# Patient Record
Sex: Male | Born: 1980 | Race: White | Hispanic: No | Marital: Married
Health system: Midwestern US, Academic
[De-identification: ages and names within clinical notes are randomized; demographics above are authoritative.]

---

## 2018-03-23 ENCOUNTER — Emergency Department: Admit: 2018-03-23 | Discharge: 2018-03-23 | Payer: MEDICARE

## 2018-03-23 ENCOUNTER — Emergency Department: Admit: 2018-03-23 | Discharge: 2018-03-24 | Disposition: A | Discharge status: Home / Self Care | Payer: MEDICARE

## 2018-03-23 ENCOUNTER — Encounter: Admit: 2018-03-23 | Discharge: 2018-03-23 | Payer: MEDICARE

## 2018-03-23 DIAGNOSIS — IMO0001 Reflux: ICD-10-CM

## 2018-03-23 DIAGNOSIS — R419 Unspecified symptoms and signs involving cognitive functions and awareness: Secondary | ICD-10-CM

## 2018-03-23 DIAGNOSIS — I619 Nontraumatic intracerebral hemorrhage, unspecified: ICD-10-CM

## 2018-03-23 DIAGNOSIS — R6889 Other general symptoms and signs: Secondary | ICD-10-CM

## 2018-03-23 DIAGNOSIS — G40409 Other generalized epilepsy and epileptic syndromes, not intractable, without status epilepticus: ICD-10-CM

## 2018-03-23 DIAGNOSIS — G988 Other disorders of nervous system: ICD-10-CM

## 2018-03-23 DIAGNOSIS — Z982 Presence of cerebrospinal fluid drainage device: Principal | ICD-10-CM

## 2018-03-23 DIAGNOSIS — G919 Hydrocephalus, unspecified: ICD-10-CM

## 2018-03-23 DIAGNOSIS — K449 Diaphragmatic hernia without obstruction or gangrene: ICD-10-CM

## 2018-03-23 DIAGNOSIS — T8509XS Other mechanical complication of ventricular intracranial (communicating) shunt, sequela: Secondary | ICD-10-CM

## 2018-03-23 LAB — COMPREHENSIVE METABOLIC PANEL
Lab: 0.5 mg/dL (ref 0.3–1.2)
Lab: 0.8 mg/dL (ref 0.4–1.24)
Lab: 106 MMOL/L (ref 98–110)
Lab: 138 MMOL/L (ref 137–147)
Lab: 15 mg/dL (ref 7–25)
Lab: 3.8 MMOL/L (ref 3.5–5.1)
Lab: 4.5 g/dL (ref 3.5–5.0)
Lab: 7.4 g/dL (ref 6.0–8.0)
Lab: 9.5 mg/dL (ref 8.5–10.6)
Lab: 99 mg/dL (ref 70–100)
Lab: >60 mL/min (ref >60)

## 2018-03-23 LAB — CBC AND DIFF: Lab: 9.2 10*3/uL (ref 4.5–11.0)

## 2018-03-23 MED ORDER — ACETAMINOPHEN 500 MG PO TAB
1000 mg | ORAL | 0 refills | Status: DC | PRN
Start: 2018-03-23 — End: 2018-03-24
  Administered 2018-03-23: 20:00:00 1000 mg via ORAL

## 2018-03-24 DIAGNOSIS — R6889 Other general symptoms and signs: ICD-10-CM

## 2018-03-24 DIAGNOSIS — R51 Headache: Principal | ICD-10-CM

## 2018-03-24 DIAGNOSIS — R419 Unspecified symptoms and signs involving cognitive functions and awareness: ICD-10-CM

## 2018-03-24 DIAGNOSIS — T8509XS Other mechanical complication of ventricular intracranial (communicating) shunt, sequela: ICD-10-CM

## 2020-03-21 ENCOUNTER — Inpatient Hospital Stay: Admit: 2020-03-21 | Discharge: 2020-03-21 | Payer: MEDICARE

## 2020-03-21 ENCOUNTER — Encounter: Admit: 2020-03-21 | Discharge: 2020-03-21 | Payer: MEDICARE

## 2020-03-23 ENCOUNTER — Encounter: Admit: 2020-03-23 | Discharge: 2020-03-23 | Payer: MEDICARE

## 2020-03-23 DIAGNOSIS — Q039 Congenital hydrocephalus, unspecified: Secondary | ICD-10-CM

## 2020-03-23 NOTE — Progress Notes
Patient calls back.  Continual headache last 4 weeks, pain along shunt tract, dizziness nausea which are intermittent. Called patient with appointments.  Patient agreed.  If symptoms worsen instructed to come to ER.  Patient voices understanding.

## 2020-03-23 NOTE — Telephone Encounter
Dr. Suan Halter calls regarding patient.  He is patient's pcp in [place], [place].  Patient having ongoing severe headaches for past 4 weeks.  Is concerned there is a shunt malfunction.  He requests I reach out to patient and provide way for assessment of shunt.  Called patient via his cell.  No answer left contact information.  Will try again.

## 2020-03-31 ENCOUNTER — Encounter: Admit: 2020-03-31 | Discharge: 2020-03-31 | Payer: MEDICARE

## 2020-03-31 ENCOUNTER — Emergency Department: Admit: 2020-03-31 | Discharge: 2020-03-31 | Payer: MEDICARE

## 2020-03-31 ENCOUNTER — Emergency Department: Admit: 2020-03-31 | Discharge: 2020-04-01 | Payer: MEDICARE

## 2020-03-31 DIAGNOSIS — Z982 Presence of cerebrospinal fluid drainage device: Secondary | ICD-10-CM

## 2020-03-31 DIAGNOSIS — G4489 Other headache syndrome: Secondary | ICD-10-CM

## 2020-03-31 LAB — CBC AND DIFF
Lab: 0 10*3/uL (ref 0–0.20)
Lab: 0.1 10*3/uL (ref 0–0.45)
Lab: 9.7 10*3/uL (ref 4.5–11.0)
Lab: 17 (ref <20.7)

## 2020-03-31 LAB — COMPREHENSIVE METABOLIC PANEL
Lab: 0.6 mg/dL (ref 0.3–1.2)
Lab: 0.8 mg/dL (ref 0.4–1.24)
Lab: 10 K/UL — ABNORMAL HIGH (ref 3–12)
Lab: 105 MMOL/L (ref 98–110)
Lab: 109 mg/dL — ABNORMAL HIGH (ref 70–100)
Lab: 12 U/L (ref 7–56)
Lab: 137 MMOL/L (ref 137–147)
Lab: 14 mg/dL (ref 7–25)
Lab: 15 U/L (ref 7–40)
Lab: 22 MMOL/L (ref 21–30)
Lab: 4.4 MMOL/L (ref 3.5–5.1)
Lab: 4.7 g/dL (ref 3.5–5.0)
Lab: 7.5 g/dL (ref 6.0–8.0)
Lab: 75 U/L — ABNORMAL LOW (ref 25–110)
Lab: 9.7 mg/dL (ref 8.5–10.6)
Lab: >60 mL/min (ref >60)
Lab: >60 mL/min (ref >60)

## 2020-03-31 MED ORDER — ONDANSETRON HCL (PF) 4 MG/2 ML IJ SOLN
4 mg | Freq: Once | INTRAVENOUS | 0 refills | Status: CP
Start: 2020-03-31 — End: ?
  Administered 2020-04-01: 01:00:00 4 mg via INTRAVENOUS

## 2020-03-31 MED ORDER — LACTATED RINGERS IV SOLP
1000 mL | Freq: Once | INTRAVENOUS | 0 refills | Status: CP
Start: 2020-03-31 — End: ?

## 2020-03-31 MED ORDER — ACETAMINOPHEN 500 MG PO TAB
1000 mg | Freq: Once | ORAL | 0 refills | Status: CP
Start: 2020-03-31 — End: ?
  Administered 2020-03-31: 22:00:00 1000 mg via ORAL

## 2020-03-31 MED ORDER — IBUPROFEN 600 MG PO TAB
600 mg | Freq: Once | ORAL | 0 refills | Status: CP
Start: 2020-03-31 — End: ?
  Administered 2020-03-31: 22:00:00 600 mg via ORAL

## 2020-03-31 NOTE — ED Notes
Pt presents to [facility] CC headache x3 weeks. Pt Aox4, denies SOA or chest pains at this time. Pt presents with complex neurological history, including VP shunt. Provider at bedside at this time.     Belongings: Shirt, pants, socks, shoes, cell phone. Belongings at bedside with pt/family

## 2020-04-01 ENCOUNTER — Encounter: Admit: 2020-04-01 | Discharge: 2020-04-01 | Payer: MEDICARE

## 2020-04-01 LAB — COVID-19 (SARS-COV-2) PCR

## 2020-04-01 NOTE — Telephone Encounter
Spoke with patient and verified full name and date of birth. Informed patient of NEGATIVE COVID 19 testing. Patient was asked if they were still experiencing any symptoms and responded he was not having any symptoms.  Patient had no further questions.

## 2020-04-01 NOTE — ED Notes
Discharge instructions discussed with patient. Pt verbalized understanding; to follow up with PCP and come to ED if symptoms worsen. No further questions or concerns at this time. Pt alert and oriented. Pt ambulated with steady gate out of department with all pt belongings.

## 2020-04-01 NOTE — Consults
Neurosurgery Consult History and Physical Examination    Jeremy Bishop  Admission Date:  03/31/2020                       Assessment/Plan:  Jeremy Bishop?is a 39 y.o.?.male?with a PMH of a shunt since birth for congenital hydrocephalus and has had multiple revisions over time due to breakages and infection who presents to the ED with concern he may have a shunt malfunction.    Patient presented with several nonspecific findings.  Exam is notable for chronic disconjugate gaze, but is otherwise intact.  CT head and shunt series was reviewed, which showed no evidence of ventriculomegaly or shunt malfunction.    The patient was evaluated and instructed that his exam findings, imaging, and history was unlikely related to his shunt.  He was offered ultrasound of his abdomen to rule out any concerns for pseudocyst, which the patient has presented with in the past.  The patient opted for follow-up, which is already scheduled for 8/13 with possibility of ultrasound at this time.  All questions were answered.  Patient elected to go home at this time.     ?Okay to discharge from neurosurgery standpoint  ?Patient is scheduled for neurosurgery follow-up 8/13  ?May consider abdominal ultrasound at follow-up appointment    - Patient and plan discussed with neurosurgery attending and senior resident  - Please call [phone] with any changes in neurologic exam, questions, or concerns.    Newman Nickels, MD  [facility]     _______________________________________________________________________    Chief Complaint:  Concern for Shunt Malfunction   History of Present Illness:   Jeremy Bishop?is a 39 y.o.?male?with a PMH of a shunt since birth for congenital hydrocephalus and has had multiple revisions over time due to breakages and infection who presents to the ED with concern he may have a shunt malfunction.      Patient has been evaluated in the ED on several occasions, last 03/23/2018.  He has a history of presenting with vague complaints in the setting of prior shunt revisions and breakages.  He fears he may have a malfunction or breakage of the shunt.  The patient presented to the ED today with complaints of headaches, pain along the shunt tract, mild word finding difficulty, imbalance, lightheadedness, sleepiness, nausea, dizziness, short-term memory issues, and numbness in the right upper extremity and face.  He noticed about 2 weeks ago he had pump to shunt a few times with resolution of his symptoms briefly, which required.    ED evaluation and work-up with CT head and shunt series was negative.  Labs are within normal.  Patient is afebrile with normal white count.  The ED staff was planning to discharge the patient, however the patient wanted further evaluation by neurosurgery.     In the past, shunt malfunction is described with severe nausea, imbalance, headaches which are intolerable.  He reports that his current symptoms are not like his prior shunt malfunction.      Past Medical History:  Medical History:   Diagnosis Date   ? Hemorrhage in the brain [facility])    ? Hiatal hernia    ? Hydrocephalus (HCC)    ? Infection of VP shunt     multiple   ? Intracranial shunt    ? OTHER DIAGNOSIS - PLEASE ANNOTATE.     Hypertension (cannot be entered in the system)   ? Reflux    ? 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 SHUNT REVISION  10/2009    multiple times   ? PR CRTJ SHUNT VENTRICULO-PERITNEAL-PLEURAL TERMINUS         Social History:  Social History     Tobacco Use   ? Smoking status: Current Every Day Smoker     Packs/day: 0.50     Years: 1.00     Pack years: 0.50   ? Smokeless tobacco: Former Neurosurgeon     Quit date: 02/05/2010   Substance Use Topics   ? Alcohol use: Yes     Comment: drinks socially only, rarely   ? Drug use: No     Types: Marijuana     Comment: last used 3 days ago       Family History:  Family History   Problem Relation Age of Onset   ? Diabetes Paternal Grandmother    ? Stroke Paternal Grandmother    ? Diabetes Maternal Grandmother    ? Cancer Maternal Grandmother    ? Multiple sclerosis Other        Allergies:    Compazine [prochlorperazine edisylate]    Medications:  (Not in a Bishop admission)      Review of Systems:   Full 10 point review of systems negative except for HPI    Physical Exam:  Vital Signs: Last Filed In 24 Hours Vital Signs: 24 Hour Range   BP: 145/86 (08/04 1330)  Temp: 36.7 ?C (98.1 ?F) (08/04 1330)  Pulse: 82 (08/04 1330)  Respirations: 12 PER MINUTE (08/04 1330)  SpO2: 98 % (08/04 1330)  Height: 172.7 cm (68) (08/04 1330) BP: (145)/(86)   Temp:  [36.7 ?C (98.1 ?F)]   Pulse:  [82]   Respirations:  [12 PER MINUTE]   SpO2:  [98 %]    Intensity Pain Scale (Self Report): 10 (03/31/20 1422)      General appearance: No acute distress   Lungs: symmetric chest rise, non labored breathing   Heart: Regular rate and rhythm  Gastrointestinal: Soft, non-distended   Musculoskeletal: Atraumatic, no significant edema   Skin: No obvious skin lesion or rash   Psychiatric: Normal affect    Neurologic Exam:   Mental Status: Awake, alert and oriented x 4, fluent speech, normal cognition  Pupils: Pupils equal round and reactive to light  Cranial Nerves: CN II-XII individually tested and found to be intact, gag not tested  Vision: within normal to confrontation, disconjugate gaze  Motor:   AA EF EE WF WE FF FE FA G HF KF KE DF PF EHL   Left 5 5 5 5 5 5 5 5 5 5 5 5 5 5 5    Right 5 5 5 5 5 5 5 5 5 5 5 5 5 5 5    Normal muscle bulk and tone  No pronator drift  Sensation: Reports subjective diminished sensation of the right face and right upper extremity, grossly normal sensation  Deep Tendon Reflexes:   Pa Ac   Left 2 2   Right 2 2     No clonus bilaterally  No hoffman bilaterally   Gait: deferred  Cerebellar: No dysmetria on FTN    Lab Tests:  Hematology:    Lab Results   Component Value Date    HGB 15.7 03/31/2020    HCT 44.9 03/31/2020  PLTCT 228 03/31/2020    WBC 9.7 03/31/2020    NEUT 75 03/31/2020    ANC 7.29 03/31/2020    ALC 1.49 03/31/2020    MONA 8 03/31/2020    AMC 0.73 03/31/2020    ABC 0.08 03/31/2020    MCV 89.4 03/31/2020    MCHC 35.0 03/31/2020    MPV 7.9 03/31/2020    RDW 12.8 03/31/2020     General Chemistry:   Lab Results   Component Value Date    NA 137 03/31/2020    K 4.4 03/31/2020    CL 105 03/31/2020    CO2 22 03/31/2020    BUN 14 03/31/2020    CR 0.82 03/31/2020    GLU 109 03/31/2020    OBSCA 1.09 10/17/2009    CA 9.7 03/31/2020    MG 2.1 11/24/2009    PO4 4.0 11/24/2009     General Chemistry:   Lab Results   Component Value Date    GAP 10 03/31/2020    ALBUMIN 4.7 03/31/2020    TOTBILI 0.6 03/31/2020    DBILI <0.1 11/19/2009    TOTPROT 7.5 03/31/2020    AST 15 03/31/2020    ALT 12 03/31/2020    ALKPHOS 75 03/31/2020       Radiology and other Diagnostics Review:    Pertinent imaging reviewed.   Shunt series   No evidence of left shunt catheter disruption or acute angulation along   the skull and within the neck.     CT Head  1. Stable indwelling left frontal approach shunt. Unchanged decompressed   slitlike lateral and third ventricles.   2. No intracranial hemorrhage or extra-axial fluid collection.   3. 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

## 2020-04-09 ENCOUNTER — Encounter: Admit: 2020-04-09 | Discharge: 2020-04-09 | Payer: MEDICARE

## 2020-04-18 ENCOUNTER — Encounter: Admit: 2020-04-18 | Discharge: 2020-04-18 | Payer: MEDICARE

## 2020-04-18 NOTE — Progress Notes
39 yr old with shaking event at 0530. Awake during event. Labs unremarkable.     Imaging:   CT head- unchanged from previous   Hx:  Hydrocephalus. VP shunt    Recommendations:  Call NS and PCP for follow up

## 2020-04-19 ENCOUNTER — Encounter: Admit: 2020-04-19 | Discharge: 2020-04-19 | Payer: MEDICARE

## 2021-01-07 ENCOUNTER — Encounter: Admit: 2021-01-07 | Discharge: 2021-01-07 | Payer: MEDICARE

## 2021-01-07 DIAGNOSIS — I619 Nontraumatic intracerebral hemorrhage, unspecified: Secondary | ICD-10-CM

## 2021-01-07 DIAGNOSIS — G919 Hydrocephalus, unspecified: Secondary | ICD-10-CM

## 2021-01-07 DIAGNOSIS — K449 Diaphragmatic hernia without obstruction or gangrene: Secondary | ICD-10-CM

## 2021-01-07 DIAGNOSIS — Z982 Presence of cerebrospinal fluid drainage device: Secondary | ICD-10-CM

## 2021-01-07 DIAGNOSIS — G988 Other disorders of nervous system: Secondary | ICD-10-CM

## 2021-01-07 DIAGNOSIS — IMO0001 Reflux: Secondary | ICD-10-CM

## 2021-01-07 DIAGNOSIS — G40409 Other generalized epilepsy and epileptic syndromes, not intractable, without status epilepticus: Secondary | ICD-10-CM

## 2022-04-02 ENCOUNTER — Encounter: Admit: 2022-04-02 | Discharge: 2022-04-02 | Payer: MEDICARE

## 2022-04-02 ENCOUNTER — Emergency Department: Admit: 2022-04-02 | Discharge: 2022-04-03 | Payer: MEDICARE

## 2022-04-02 ENCOUNTER — Emergency Department: Admit: 2022-04-02 | Discharge: 2022-04-02 | Payer: MEDICARE

## 2022-04-02 DIAGNOSIS — Z982 Presence of cerebrospinal fluid drainage device: Secondary | ICD-10-CM

## 2022-04-02 DIAGNOSIS — G40409 Other generalized epilepsy and epileptic syndromes, not intractable, without status epilepticus: Secondary | ICD-10-CM

## 2022-04-02 DIAGNOSIS — G919 Hydrocephalus, unspecified: Secondary | ICD-10-CM

## 2022-04-02 DIAGNOSIS — G988 Other disorders of nervous system: Secondary | ICD-10-CM

## 2022-04-02 DIAGNOSIS — K449 Diaphragmatic hernia without obstruction or gangrene: Secondary | ICD-10-CM

## 2022-04-02 DIAGNOSIS — I619 Nontraumatic intracerebral hemorrhage, unspecified: Secondary | ICD-10-CM

## 2022-04-02 DIAGNOSIS — IMO0001 Reflux: Secondary | ICD-10-CM

## 2022-04-02 LAB — SED RATE: ESR: 31 mm/h — ABNORMAL HIGH (ref 0–15)

## 2022-04-02 LAB — COMPREHENSIVE METABOLIC PANEL
ALBUMIN: 4.3 g/dL (ref 3.5–5.0)
ALK PHOSPHATASE: 76 U/L (ref 25–110)
ALT: 23 U/L (ref 7–56)
ANION GAP: 12 (ref 3–12)
AST: 16 U/L (ref 7–40)
BLD UREA NITROGEN: 16 mg/dL (ref 7–25)
CALCIUM: 9.2 mg/dL (ref 8.5–10.6)
CHLORIDE: 106 MMOL/L (ref 98–110)
CO2: 20 MMOL/L — ABNORMAL LOW (ref 21–30)
CREATININE: 0.9 mg/dL (ref 0.4–1.24)
EGFR: >60 mL/min (ref >60)
GLUCOSE,PANEL: 101 mg/dL — ABNORMAL HIGH (ref 70–100)
POTASSIUM: 4.3 MMOL/L (ref 3.5–5.1)
SODIUM: 138 MMOL/L (ref 137–147)
TOTAL BILIRUBIN: 0.4 mg/dL (ref 0.3–1.2)
TOTAL PROTEIN: 7.3 g/dL (ref 6.0–8.0)

## 2022-04-02 LAB — CBC AND DIFF
ABSOLUTE BASO COUNT: 0 K/UL (ref 0–0.20)
ABSOLUTE EOS COUNT: 0.1 K/UL (ref 0–0.45)
ABSOLUTE LYMPH COUNT: 1.4 K/UL (ref 1.0–4.8)
ABSOLUTE MONO COUNT: 0.7 K/UL (ref 0–0.80)
ABSOLUTE NEUTROPHIL: 6 K/UL (ref 1.8–7.0)
BASOPHILS %: 1 % (ref 0–2)
EOSINOPHILS %: 1 % (ref 0–5)
HEMATOCRIT: 43 % (ref 40–50)
HEMOGLOBIN: 15 g/dL (ref 13.5–16.5)
LYMPHOCYTES %: 17 % — ABNORMAL LOW (ref 24–44)
MCH: 30 pg (ref 26–34)
MCHC: 34 g/dL (ref 32.0–36.0)
MCV: 89 FL (ref 80–100)
MONOCYTES %: 9 % (ref 4–12)
MPV: 8.5 FL (ref 7–11)
NEUTROPHILS %: 72 % (ref 41–77)
PLATELET COUNT: 241 K/UL (ref 150–400)
RBC COUNT: 4.8 M/UL (ref 4.4–5.5)
RDW: 12 % (ref 11–15)
WBC COUNT: 8.3 K/UL (ref 4.5–11.0)

## 2022-04-02 LAB — C REACTIVE PROTEIN (CRP): C-REACTIVE PROTEIN: 0.1 mg/dL (ref <1.0)

## 2022-04-02 LAB — MAGNESIUM: MAGNESIUM: 2.3 mg/dL (ref 1.6–2.6)

## 2022-04-02 LAB — POC LACTATE: LACTIC ACID POC: 2 MMOL/L (ref 0.5–2.0)

## 2022-04-02 NOTE — Consults
Neurosurgery Consult History and Physical Note      Admission Date: 04/02/2022                                                LOS: 0 days    Reason for Consult: Concern for shunt infection/failure    Consult type: Written opinion only    Consulting Physician: Neurosurgery Attendings: Sherrine Maples, MD    Requesting Physician: Derrell Lolling, Cleda Daub, MD      Assessment/Plan:  Jeremy Bishop is a 41 y.o. male w/ reported congenital hydrocepahlus s/p VP shunt placement at birth w/ mulitple revisons most recently in 2011. He has a Medtronic delta valve on the left side that is a ventriculo pleural catheter. CTH demonstrates stability of slit-like ventricles. SS demonstrates intact hardware. He has a Medtronic delta valve on the left side with distal catheter that appears to drain in the left pleural space. Afebrile. WBC 8.3. ESR 31. CRP 0.16. No PTA AC or AP.     - No acute neurosurgical intervention  - Ventricle size stable from prior encounters where shunt was found to be functional  - Shunt series appears intact without kinking or disconnection  - Blood work and clinical history do not suggest shunt infection at this time.  - Patient safe to discharge from neurosurgical standpoint  - Discussed w/ patient the importance of maintaining routine follow up with a neurosurgeon for his shunt. He is agreeable to see Dr. Lorel Monaco routinely in the outpatient setting   >Appointment requested     Salvadore Oxford, D.O.  Neurological Surgery  Pager: [phone]    _____________________________________________________________________    History of Present Illness:   Jeremy Bishop is a 41 y.o. 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 In 2011, they placed a new left ventriculopleural shunt that has not been revised since implantation. He has seen multiple neurosurgeons over his lifetime. He last saw Dr. Lorel Monaco in clinic in 12/2015. He has been evaluated in the emergency department many times with most recent in 03/2020. He reports four month history of a myriad of symptoms including tenderness to palpation of the skin overlying the shunt on his scalp and and on his chest. He also reports requiring more sleep than usual. He also reports blurred vision when looking at lights. Endorses episodes of chills but has not had one recently. CTH demonstrates stability of slit-like ventricles. SS demonstrates intact hardware. He has a Medtronic delta valve on the left side with distal catheter that appears to drain in the left pleural space. Afebrile. WBC 8.3. ESR 31. CRP 0.16  No PTA AC or AP.     Past Medical History:  Medical History:   Diagnosis Date   ? Hemorrhage in the brain [facility])    ? Hiatal hernia    ? Hydrocephalus (HCC)    ? Infection of VP shunt     multiple   ? Intracranial shunt    ? OTHER DIAGNOSIS - PLEASE ANNOTATE.     Hypertension (cannot be entered in the system)   ? Reflux    ? 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 SHUNT REVISION  10/2009    multiple times   ? PR CRTJ SHUNT VENTRICULO-PERITNEAL-PLEURAL TERMINUS         Social History:  Social History     Tobacco Use   ? Smoking status: Every Day     Packs/day: 0.50     Years: 1.00     Pack years: 0.50     Types: Cigarettes   ? Smokeless tobacco: Former     Quit date: 02/05/2010   Substance Use Topics   ? Alcohol use: Yes     Comment: drinks socially only, rarely   ? Drug use: Never     Types: Marijuana     Comment: last used 3 days ago       Family History:  Family History   Problem Relation Age of Onset   ? Diabetes Paternal Grandmother    ? Stroke Paternal Grandmother    ? Diabetes Maternal Grandmother    ? Cancer Maternal Grandmother    ? Multiple sclerosis Other        Allergies:    Compazine [prochlorperazine edisylate]    Medications:  PTA:  Current Outpatient Medications   Medication Instructions   ? ascorbic acid (vitamin C) (C-500) 500 mg, Oral, DAILY   ? CHOLEcalciferoL (vitamin D3) (VITAMIN D3) 1,000 Units, Oral, DAILY   ? Magnesium 250 mg tab 1 tablet, Oral, DAILY, Take with 400mg  for a total daily dose of 650mg     ? magnesium oxide 400 mg cap 1 capsule, Oral, DAILY, Take with 250mg  for total daily dose of 650mg .    ? other medication 1 Dose, Oral, DAILY, Patient is taking (Hydroxycut) po q d.  He is taking (2) of these         Inpatient:  Scheduled Meds:Continuous Infusions:  PRN and Respiratory Meds:      Review of Systems:  Full 10 point review of systems negative except for HPI      Physical Exam:  Vital Signs:  Last Filed in 24 hours Vital Signs:  24 hour Range    BP: 145/92 (08/06 1339)  Temp: 36.8 ?C (98.3 ?F) (08/06 1142)  Pulse: 101 (08/06 1339)  Respirations: 14 PER MINUTE (08/06 1339)  SpO2: 97 % (08/06 1339)  O2 Device: None (Room air) (08/06 1119)  SpO2 Pulse: 106 (08/06 1339)  Height: 170.2 cm (5' 7) (08/06 1119) BP: (137-189)/(92-101)   Temp:  [36.6 ?C (97.9 ?F)-36.8 ?C (98.3 ?F)]   Pulse:  [101-132]   Respirations:  [14 PER MINUTE-20 PER MINUTE]   SpO2:  [95 %-98 %]   O2 Device: None (Room air)     General appearance: No acute distress  Lungs: breathing comfortably on room air, equal bilateral chest rise  Heart: extremities warm and well perfused  Gastrointestinal: non-distended  Musculoskeletal: No edema, redness or tenderness in the calves or thighs  Skin: Skin overlying shunt valve is non-tender, non-erythematous and without signs of infection or erosion  Psychiatric: Normal affect    Neurologic Exam:   Mental Status: Awake, alert and oriented x 4, fluent speech, normal cognition  Pupils: Pupils equal round and reactive to light  Cranial Nerves: Strabismus. Otherwise, CN II-XII individually tested and found to be intact, gag not tested  Motor:   AA EF EE FA G HF KF KE DF PF EHL   Left  5 5  5 5 5 5 5 5     Right  5 5  5  5  5 5 5 5     Normal muscle bulk and tone  No pronator drift  Sensation: Sensation intact to light touch throughout  Deep Tendon Reflexes:  No clonus bilaterally  No hoffman's sign bilaterally  Gait: Deferred    LabTests:  Hematology:    Lab Results   Component Value Date    HGB 15.1 04/02/2022    HCT 43.4 04/02/2022    PLTCT 241 04/02/2022    WBC 8.3 04/02/2022    NEUT 72 04/02/2022    ANC 6.04 04/02/2022    ALC 1.40 04/02/2022    MONA 9 04/02/2022    AMC 0.76 04/02/2022    ABC 0.04 04/02/2022    MCV 89.0 04/02/2022    MCHC 34.7 04/02/2022    MPV 8.5 04/02/2022    RDW 12.8 04/02/2022     General Chemistry:   Lab Results   Component Value Date    NA 137 03/31/2020    K 4.4 03/31/2020    CL 105 03/31/2020    CO2 22 03/31/2020    BUN 14 03/31/2020    CR 0.82 03/31/2020    GLU 109 03/31/2020    OBSCA 1.09 10/17/2009    CA 9.7 03/31/2020    MG 2.1 11/24/2009    PO4 4.0 11/24/2009     General Chemistry:   Lab Results   Component Value Date    GAP 10 03/31/2020    ALBUMIN 4.7 03/31/2020    TOTBILI 0.6 03/31/2020    DBILI <0.1 11/19/2009    TOTPROT 7.5 03/31/2020    AST 15 03/31/2020    ALT 12 03/31/2020    ALKPHOS 75 03/31/2020       Radiology and other Diagnostics Review:      CTH/SS reviewed

## 2022-04-02 NOTE — ED Notes
Jeremy Bishop is a 41 y.o. male pt presents to ED 77 with CC headache and possible shunt issue. Pt endorses 8/10 headache. Endorses unable to sleep on left side where shunt is located on left side of head and left chest where shunt drains. Pt states symptoms are similar to previous shunt infection. What is different this time is pt endorses cloudy vision in right eye and vision becoming cloudy in left eye. Pt denies changes in mentation, nausea, vommiting and/or further complaints at this time. Pt A&Ox4, respirations even unlabored on ra, skin warm/dry, capillary refill <2 seconds, and mucous membranes pink/moist. Pt placed on spo2, cardiac, bp monitoring. Pt resting on locked cart lowest position with call light within reach.    Medical History:   Diagnosis Date    Hemorrhage in the brain [facility])     Hiatal hernia     Hydrocephalus (HCC)     Infection of VP shunt     multiple    Intracranial shunt     OTHER DIAGNOSIS - PLEASE ANNOTATE.     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

## 2022-04-02 NOTE — ED Notes
Pt back from xray; reconnected to monitor; ao4; resp unlabored; denies any additional needs at this time

## 2022-04-03 NOTE — ED Notes
Pt given dc instructions and is to follow up with nsgy; acknowledges understanding and signs; iv removed, cath intact, dressed, bleeding ctrl'd; all belongings accounted for and with pt on depart, confirmed by pt, pt''s spouse and rn; ao4; pt ambulates to the exit with spouse    Discussed:  F/u up nsgy clinic  Return to ed criteria

## 2022-04-03 NOTE — Unmapped
Thank you for choosing [facility] and the Department of Emergency Medicine for your healthcare needs.    You were seen in the emergency department for an evaluation of your VP shunt.  Your imaging was reassuring and neurosurgery saw and evaluated you and determined that you are safe for discharge home and may follow-up with them as an outpatient.  Should you experience worsening symptoms please report to your nearest emergency department.    If your condition required a prescription medication, it has been e-prescribed (sent electronically by computer) to the preferred pharmacy that we have on file for you and you do not need a paper copy of the prescription.  Please pick up your medication from your pharmacy as soon as possible.  If your pharmacy does not yet accept electronic prescriptions, a paper copy of the prescription has been provided to you and you must take this to the pharmacy of your choice to have it filled.  Please review your after visit summary to ensure that we have the correct pharmacy on file for you.  Please let us know immediately if we need to update your information to reflect your current and preferred pharmacy.  Please contact us immediately if you encounter any problems related to the electronic prescribing process.  Please call or return to the emergency department if you are unable to obtain your prescription medication.  Any request for medication refills should be directed to your primary care physician.    Please follow up with the designated physician(s) as instructed.  If you do not have a primary care physician, you need to establish care with one.  Ask your physician to obtain your records and go over all results in detail.  Some of the results provided to you today may be preliminary results and significant changes will be provided to you as necessary.  However, there may be incidental findings unrelated to the reason(s) for today's visit that will require non-emergent follow up in the near future by you and your primary care physician.      If you received any narcotic pain medications or sedatives while in the emergency department, you should NOT drive and you should not drive or operate machinery for 24 hours or while on those medications.    If your blood pressure was over 130/90, you should see your doctor to get your blood pressure rechecked.  Your doctor may start medications to control your blood pressure.      If your blood sugar was elevated, you should see your doctor to get your blood sugar rechecked.  Your doctor may start medications to control your blood sugar.      If you have been diagnosed with heart failure, weigh yourself daily and notify your physician of a weight gain of more than 2 pounds in a day or 3-5 pounds in a week.    If you were diagnosed with seizures, you may NOT drive for the next 6 months -- this is state law.  You should not drive, operate any heavy machinery, bathe or shower while home alone or with the bathroom door locked, swim alone, climb up on a ladder, use firearms, or do anything that would put you or others in danger should you have another seizure during that activity.  Please schedule an appointment for follow-up with both your primary care physician and with the department of neurology.    If you have a wound, we have done our best to clean  and care for the injury.  There may be retained foreign bodies that could not be seen, found, or removed.  Watch for signs of infection (redness, warmth, swelling, discharge, fever) and return to the emergency department, or follow-up with your regular physician, if any of these occur.  Sutures on the face should be removed in 5-7 days, or as otherwise instructed.  Sutures and staples on other areas of the body should be removed in 10-14 days, or as otherwise instructed.  Do not put any petroleum-based antibiotic ointment on wound adhesive (glue) because it will cause the adhesive to break down and fail.  You may safely shower and cleanse your repaired wounds with soap and water after 24 hours, but do not soak wounds or get them wet for prolonged periods of time (no soaking, bathing, or swimming).    Alcohol and drugs are bad for your health.  If you are under the age of 57 you cannot drink alcohol legally.  If you are 70 years of age or older and you drink alcohol, you should not drink in excess, you should only drink responsibly, and you should never drink and drive.  If you live in a state that has legalized marijuana, please use responsibly.  Do NOT use illegal drugs such as cocaine, methamphetamine, heroin, or phencyclidine.  If you are addicted to any of these substances and are ready to quit there are resources to help you do so.  Please ask any health care provider for help.    Smoking, vaping or chewing tobacco is bad for your health.  Do not start using these products.  If you use any form of tobacco, it is highly recommended that you stop using these products.  You may need help to quit using tobacco products and you should ask any of your health care providers about resources available to assist in quitting.       You may return to the emergency department at any time and for any health care concern that you believe is in need of emergent, urgent, or timely evaluation.

## 2022-04-24 ENCOUNTER — Encounter: Admit: 2022-04-24 | Discharge: 2022-04-24 | Payer: MEDICARE

## 2022-04-24 ENCOUNTER — Ambulatory Visit: Admit: 2022-04-24 | Discharge: 2022-04-24 | Payer: MEDICARE

## 2022-04-24 DIAGNOSIS — Z982 Presence of cerebrospinal fluid drainage device: Secondary | ICD-10-CM

## 2022-04-24 DIAGNOSIS — K449 Diaphragmatic hernia without obstruction or gangrene: Secondary | ICD-10-CM

## 2022-04-24 DIAGNOSIS — G988 Other disorders of nervous system: Secondary | ICD-10-CM

## 2022-04-24 DIAGNOSIS — Q039 Congenital hydrocephalus, unspecified: Secondary | ICD-10-CM

## 2022-04-24 DIAGNOSIS — I619 Nontraumatic intracerebral hemorrhage, unspecified: Secondary | ICD-10-CM

## 2022-04-24 DIAGNOSIS — G919 Hydrocephalus, unspecified: Secondary | ICD-10-CM

## 2022-04-24 DIAGNOSIS — IMO0001 Reflux: Secondary | ICD-10-CM

## 2022-04-24 DIAGNOSIS — G40409 Other generalized epilepsy and epileptic syndromes, not intractable, without status epilepticus: Secondary | ICD-10-CM

## 2022-04-24 NOTE — Patient Instructions
It was nice to see you today.  Thank you for choosing to visit our clinic.  Your time is important, and if you had to wait today, we do apologize.  Our goal is to run exactly on time.  However, on occasion, we get behind in clinic due to unexpected patient issues.  Thank you for your patience.     General Instructions:  Scheduling:  Our scheduling phone number is [phone] option #1.   How to reach our office:  Please send a MyChart message to Neurosurgery or leave a voicemail for the nurse, Elane Fritz [phone].  How to get a medication refill:  Please use the MyChart Refill request or contact your pharmacy directly to request medication refills.  Please allow 72 business hours for request to be completed.    Support for many chronic illnesses is available through [employer] at [email or web address] or [phone].    For help with MyChart:  please call [phone].    For questions on nights, weekends or holidays:  call the Operator at [phone], and ask for the doctor on call for Neurosurgery.    For more information on spinal conditions:  please visit [email or web address]   Our office fax number is [phone]     Again, thank you for coming in today.      Jeremy Bishop, Jeremy Bishop  FUS Coordinator  Clinical Nurse Coordinator for:  Dr. Lorel Monaco   Department of Neurosurgery/[place]  Ph: [phone]   Fax: [phone]      For up to date information on the COVID-19 virus, visit the [facility] website. [email or web address]   General supportive care during cold and flu season and infection prevention reminders:    o Wash hands often with soap and water for at least 20 seconds   o Cover your mouth and nose   o Social distancing: try to maintain 6 feet between you and other people   o Stay home if sick and symptoms mild or manageable?  If you must be around people wear a mask     If you are having symptoms of a lower respiratory infection (cough, shortness of breath) and/or fever AND either traveled in last 30 days (internationally or to region of exposure) OR known exposure to patient with COVID19:     o Call your primary care provider for questions or health needs.   Tell your doctor about your recent travel and your symptoms     o In a medical emergency, call 911 or go to the nearest emergency room.    Falls Can Be Prevented  Elderly Trauma Patients  Talk to Your Doctor  Talk to your healthcare provider to assess your risk for falling. Ask them how you can prevent falling.  Ask your healthcare provider or pharmacist to review your medicines.  Some medicine combinations might make you dizzy or sleepy. This should include prescription medicines and over-the-counter medicines.  Ask your healthcare provider about taking vitamin D supplements.     Do Strength and Balance Exercises  Do exercises that make your legs stronger and improve your balance. Tai Chi is a good example of this kind of exercise.     Have Your Eyes Checked  Have your eyes checked by an eye doctor once a year. Update your eyeglasses if needed.  Bifocals can make things seem closer or farther away than they really are. This can affect your balance and make you more likely to fall. If you use bifocals, have a pair of glasses without bifocals for  outdoor activities, such as walking.     Make Your Home Safer  Remove things you could trip over.  Add grab bars inside and outside your tub or shower and next to the toilet.  Put railings on both sides of stairs.  Make sure your home has lots of light by adding more or brighter light bulbs.        Washing Your Hands  Step-by-Step    [employer] last reviewed this educational content on 07/28/2021  ? 2000-2023 [employer], [place]. All rights reserved. This information is not intended as a substitute for professional medical care. Always follow your healthcare professional's instructions.

## 2022-06-21 NOTE — Progress Notes
Body mass index is 36.96 kg/m?.      OCT OPTIC NERVE        Optic Nerve  Right Eye  Findings location Temporal Optic nerve findings: Abnormal Thinning Progression has no prior data.     Left Eye  Findings location Nasal, Temporal Optic nerve findings: Abnormal Thinning Progression has no prior data.     Notes  Marked temporal thinning OD; trace nasal/temporal thinning OS       VISUAL FIELD, EXTEND        [facility] Automated exam type was used.     Right Eye  Threshold was 24-2. Strategy was SITA Standard. Reliability was borderline. Findings location Inferior, Right Paracentral Scotoma     Left Eye  Threshold was 24-2. Strategy was SITA Standard. Reliability was borderline. 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 Philippa Chester whom he saw on 03/02/22 for complaints of blurred vision OS>OD.  Clinic notes for that visit kindly supplied by Dr. Dareen Piano show an essentially normal exam with HVF 30-2 SITA std showing some constriction of the periphery OU.  The patient was then sent here.  Problem   Exotropia of Right Eye          This nice man with the above history today demonstrates 20/125 and 20/30 uncorrected acuity with a right afferent pupillary defect and right dyschromatopsia.  Automated visual fields have borderline reliability but may show a right inferior homonymous defect.  OCT imaging shows temporal RNFL thinning right optic nerve and trace nasal/temporal thinning OS.  Muscle balance and motility shows a longstanding right exotropia with suppression.  Anterior segment exam is unremarkable.  Dilated exam shows sharp/flat optic nerves without edema OU.  Right optic nerve has trace temporal pallor of unknown chronicity.  The OCT notwithstanding, I see no clear pallor of the left optic nerve.    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  We strongly recommend he wear safety lenses at all times to protect his left eye.  He said he'll check in with Dr. Dareen Piano to accomplish that ASAP.

## 2022-06-23 ENCOUNTER — Encounter: Admit: 2022-06-23 | Discharge: 2022-06-23 | Payer: MEDICARE

## 2022-06-23 ENCOUNTER — Ambulatory Visit: Admit: 2022-06-23 | Discharge: 2022-06-23 | Payer: MEDICARE

## 2022-06-23 DIAGNOSIS — Q039 Congenital hydrocephalus, unspecified: Secondary | ICD-10-CM

## 2022-06-23 DIAGNOSIS — I619 Nontraumatic intracerebral hemorrhage, unspecified: Secondary | ICD-10-CM

## 2022-06-23 DIAGNOSIS — Z982 Presence of cerebrospinal fluid drainage device: Secondary | ICD-10-CM

## 2022-06-23 DIAGNOSIS — IMO0001 Reflux: Secondary | ICD-10-CM

## 2022-06-23 DIAGNOSIS — K449 Diaphragmatic hernia without obstruction or gangrene: Secondary | ICD-10-CM

## 2022-06-23 DIAGNOSIS — G40409 Other generalized epilepsy and epileptic syndromes, not intractable, without status epilepticus: Secondary | ICD-10-CM

## 2022-06-23 DIAGNOSIS — G988 Other disorders of nervous system: Secondary | ICD-10-CM

## 2022-06-23 DIAGNOSIS — G919 Hydrocephalus, unspecified: Secondary | ICD-10-CM

## 2022-06-23 DIAGNOSIS — H50111 Monocular exotropia, right eye: Secondary | ICD-10-CM

## 2024-07-27 IMAGING — MR BRAINWO
9 of 13 series · 28 of 48 positions shown · non-contrast
Comparison: none

[Series 5: T1 · sagittal · 5.0mm · 0.80mm/px · 3 of 27 slices shown (1 of 2)]
[im 1/27]
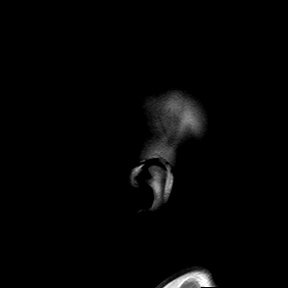
[im 14/27]
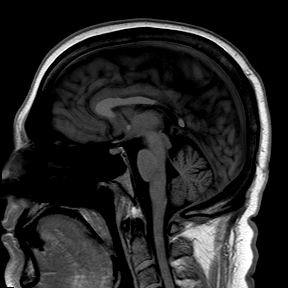
[im 27/27]
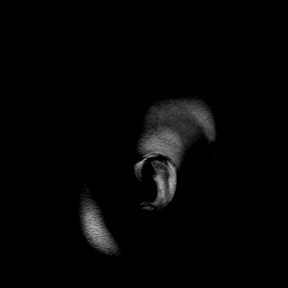

[Series 6: DWI · axial · 5.0mm · 1.44mm/px · z∈[-93,+62]mm · 3 of 27 slices shown (1 of 3)]
[im 1/27]
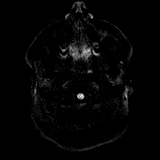
[im 14/27]
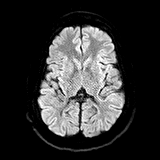
[im 27/27]
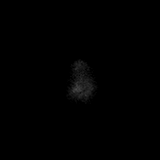

[Series 6: DWI · axial · 5.0mm · 1.44mm/px · z∈[-93,+62]mm · 3 of 27 slices shown (2 of 3)]
[im 1/27]
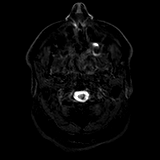
[im 14/27]
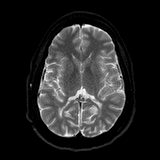
[im 27/27]
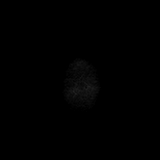

[Series 7: DWI · axial · 5.0mm · 1.44mm/px · z∈[-93,+62]mm · 3 of 27 slices shown (3 of 3)]
[im 1/27]
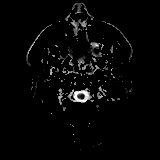
[im 14/27]
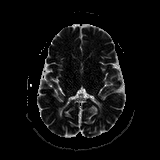
[im 27/27]
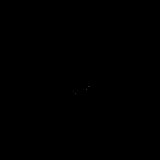

[Series 8: FLAIR · axial · 5.0mm · 0.72mm/px · z∈[-94,+62]mm · 3 of 27 slices shown]
[im 1/27]
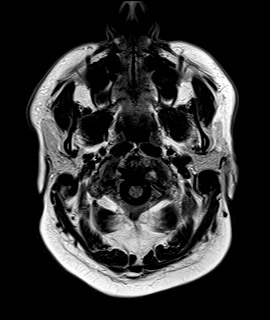
[im 14/27]
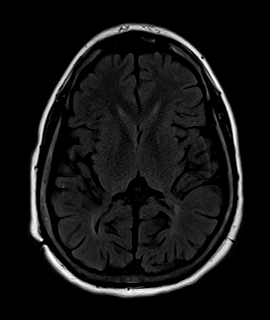
[im 27/27]
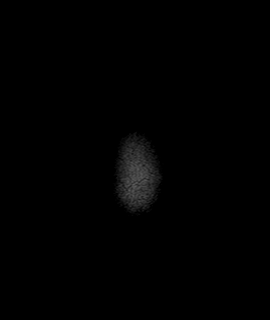

[Series 9: T2 · axial · 5.0mm · 0.45mm/px · z∈[-94,+62]mm · 3 of 27 slices shown (1 of 2)]
[im 1/27]
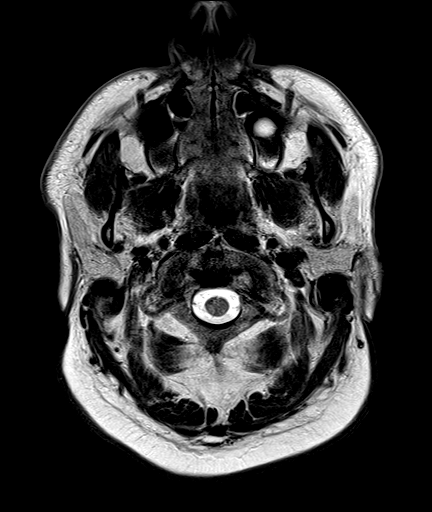
[im 14/27]
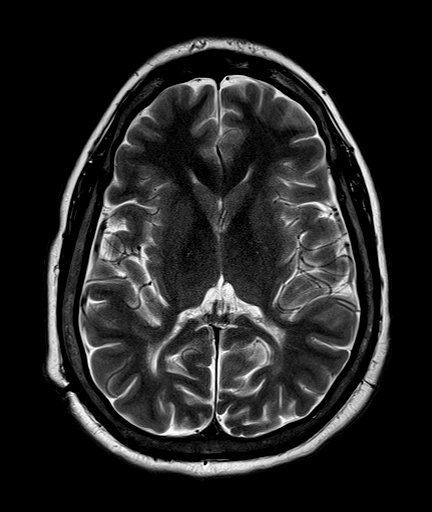
[im 27/27]
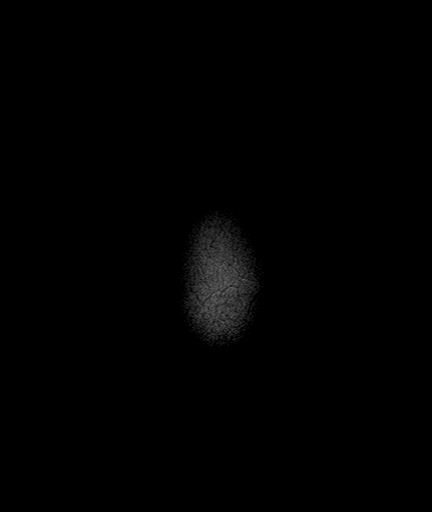

[Series 10: T1 · axial · 5.0mm · 0.72mm/px · z∈[-94,+62]mm · 3 of 27 slices shown (2 of 2)]
[im 1/27]
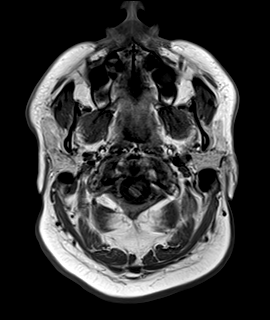
[im 14/27]
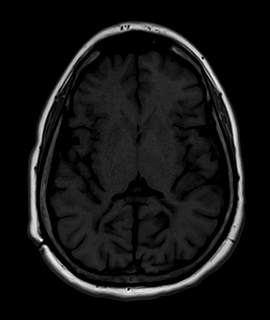
[im 27/27]
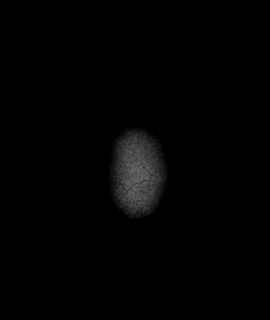

[Series 11: GRE · axial · 5.0mm · 0.45mm/px · z∈[-94,+62]mm · 3 of 27 slices shown]
[im 1/27]
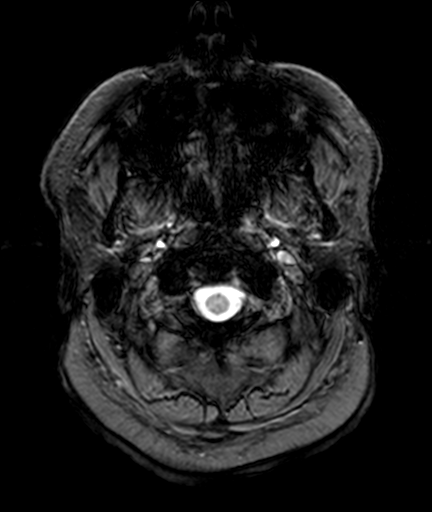
[im 14/27]
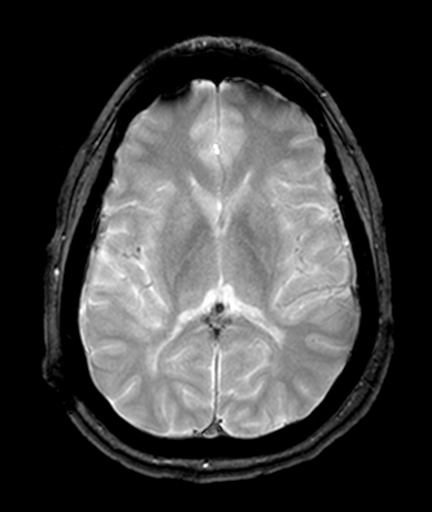
[im 27/27]
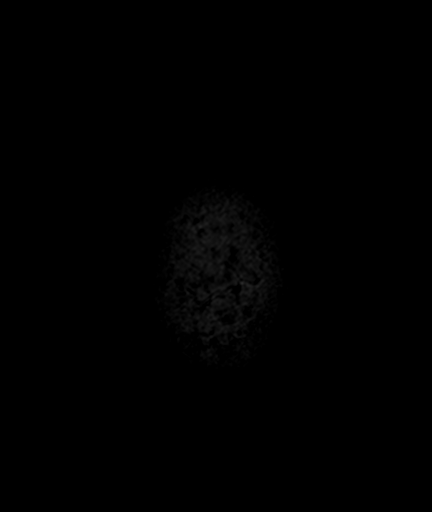

[Series 12: T2 · coronal · 4.0mm · 0.72mm/px · 4 of 35 slices shown (2 of 2)]
[im 1/35]
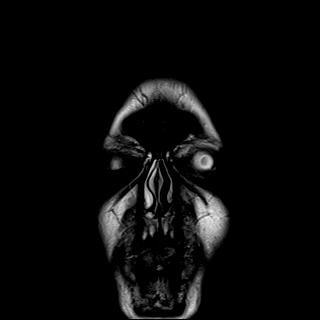
[im 12/35]
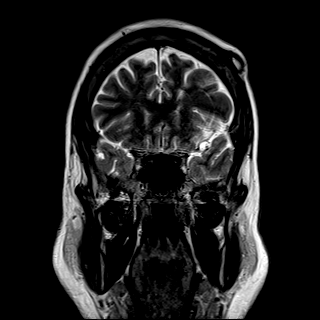
[im 23/35]
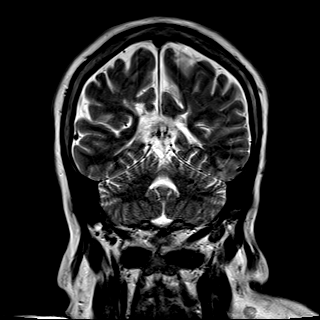
[im 35/35]
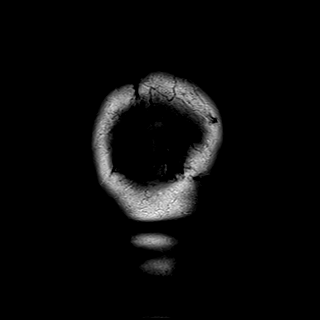

[28 of 48 positions shown; findings below may reference images not displayed]

DIAGNOSTIC STUDIES

EXAM

MR head/brain wo con

INDICATION

Headaches, L side pain
ACUTE ONSET HEADACHES, DIZZINESS, NAUSEA, SHUNT TENDERNESS AND LEFT EYE FOGGINESS X 3-4 MONTHS. HX
HYDROCEPHALUS WITH SHUNT PLACEMENT, REVISION 8055. RG

TECHNIQUE

Sagittal axial and coronal images were obtained with variable T1 and T2 weighting.

COMPARISONS

None available.

FINDINGS

No abnormal signal properties are seen of the brainstem or visualized cervical cord. The expected
signal voids are noted throughout the major intracranial vascular structures, mastoid air cells, and
paranasal sinuses.

Encephalomalacia changes related to prior parietal are evident with volume loss and gliosis. Left
frontal approach ventricular catheter is seen.

There is no intracranial mass effect or hemorrhage. Minimal cortical atrophy is noted. No diffusion
restriction is evident.

IMPRESSION

Postop changes as described. No intracranial hemorrhage or mass effect is seen.

Tech Notes:

ACUTE ONSET HEADACHES, DIZZINESS, NAUSEA, SHUNT TENDERNESS AND LEFT EYE FOGGINESS X 3-4 MONTHS. HX
HYDROCEPHALUS WITH SHUNT PLACEMENT, REVISION [id number]. RG
